# Patient Record
Sex: Male | Born: 1995 | Hispanic: Yes | Marital: Single | Smoking: Current every day smoker
Health system: Southern US, Community
[De-identification: ages and names within clinical notes are randomized; demographics above are authoritative.]

---

## 2017-08-12 ENCOUNTER — Ambulatory Visit (INDEPENDENT_AMBULATORY_CARE_PROVIDER_SITE_OTHER): Payer: Worker's Compensation

## 2017-08-12 ENCOUNTER — Ambulatory Visit (INDEPENDENT_AMBULATORY_CARE_PROVIDER_SITE_OTHER): Payer: Worker's Compensation | Admitting: Emergency Medicine

## 2017-08-12 DIAGNOSIS — S6701XA Crushing injury of right thumb, initial encounter: Secondary | ICD-10-CM

## 2017-08-12 DIAGNOSIS — S61011A Laceration without foreign body of right thumb without damage to nail, initial encounter: Secondary | ICD-10-CM

## 2017-08-12 DIAGNOSIS — M79644 Pain in right finger(s): Secondary | ICD-10-CM

## 2017-08-12 DIAGNOSIS — S62521B Displaced fracture of distal phalanx of right thumb, initial encounter for open fracture: Secondary | ICD-10-CM

## 2017-08-12 MED ORDER — HYDROCODONE-ACETAMINOPHEN 5-325 MG PO TABS: 1.0000 | ORAL_TABLET | Freq: Four times a day (QID) | ORAL | 0 refills | Status: DC | PRN

## 2017-08-12 MED ORDER — CEPHALEXIN 500 MG PO CAPS: 500.0000 mg | ORAL_CAPSULE | Freq: Three times a day (TID) | ORAL | 0 refills | Status: AC

## 2017-08-12 NOTE — Patient Instructions (Addendum)
Laceration Care, Adult A laceration is a cut that goes through all layers of the skin. The cut also goes into the tissue that is right under the skin. Some cuts heal on their own. Others need to be closed with stitches (sutures), staples, skin adhesive strips, or wound glue. Taking care of your cut lowers your risk of infection and helps your cut to heal better. How to take care of your cut For stitches or staples:  Keep the wound clean and dry.  If you were given a bandage (dressing), you should change it at least one time per day or as told by your doctor. You should also change it if it gets wet or dirty.  Keep the wound completely dry for the first 24 hours or as told by your doctor. After that time, you may take a shower or a bath. However, make sure that the wound is not soaked in water until after the stitches or staples have been removed.  Clean the wound one time each day or as told by your doctor: ? Wash the wound with soap and water. ? Rinse the wound with water until all of the soap comes off. ? Pat the wound dry with a clean towel. Do not rub the wound.  After you clean the wound, put a thin layer of antibiotic ointment on it as told by your doctor. This ointment: ? Helps to prevent infection. ? Keeps the bandage from sticking to the wound.  Have your stitches or staples removed as told by your doctor. If your doctor used skin adhesive strips:  Keep the wound clean and dry.  If you were given a bandage, you should change it at least one time per day or as told by your doctor. You should also change it if it gets dirty or wet.  Do not get the skin adhesive strips wet. You can take a shower or a bath, but be careful to keep the wound dry.  If the wound gets wet, pat it dry with a clean towel. Do not rub the wound.  Skin adhesive strips fall off on their own. You can trim the strips as the wound heals. Do not remove any strips that are still stuck to the wound. They will fall  off after a while. If your doctor used wound glue:  Try to keep your wound dry, but you may briefly wet it in the shower or bath. Do not soak the wound in water, such as by swimming.  After you take a shower or a bath, gently pat the wound dry with a clean towel. Do not rub the wound.  Do not do any activities that will make you really sweaty until the skin glue has fallen off on its own.  Do not apply liquid, cream, or ointment medicine to your wound while the skin glue is still on.  If you were given a bandage, you should change it at least one time per day or as told by your doctor. You should also change it if it gets dirty or wet.  If a bandage is placed over the wound, do not let the tape for the bandage touch the skin glue.  Do not pick at the glue. The skin glue usually stays on for 5-10 days. Then, it falls off of the skin. General Instructions  To help prevent scarring, make sure to cover your wound with sunscreen whenever you are outside after stitches are removed, after adhesive strips are removed, or   when wound glue stays in place and the wound is healed. Make sure to wear a sunscreen of at least 30 SPF.  Take over-the-counter and prescription medicines only as told by your doctor.  If you were given antibiotic medicine or ointment, take or apply it as told by your doctor. Do not stop using the antibiotic even if your wound is getting better.  Do not scratch or pick at the wound.  Keep all follow-up visits as told by your doctor. This is important.  Check your wound every day for signs of infection. Watch for: ? Redness, swelling, or pain. ? Fluid, blood, or pus.  Raise (elevate) the injured area above the level of your heart while you are sitting or lying down, if possible. Get help if:  You got a tetanus shot and you have any of these problems at the injection site: ? Swelling. ? Very bad pain. ? Redness. ? Bleeding.  You have a fever.  A wound that was  closed breaks open.  You notice a bad smell coming from your wound or your bandage.  You notice something coming out of the wound, such as wood or glass.  Medicine does not help your pain.  You have more redness, swelling, or pain at the site of your wound.  You have fluid, blood, or pus coming from your wound.  You notice a change in the color of your skin near your wound.  You need to change the bandage often because fluid, blood, or pus is coming from the wound.  You start to have a new rash.  You start to have numbness around the wound. Get help right away if:  You have very bad swelling around the wound.  Your pain suddenly gets worse and is very bad.  You notice painful lumps near the wound or on skin that is anywhere on your body.  You have a red streak going away from your wound.  The wound is on your hand or foot and you cannot move a finger or toe like you usually can.  The wound is on your hand or foot and you notice that your fingers or toes look pale or bluish. This information is not intended to replace advice given to you by your health care provider. Make sure you discuss any questions you have with your health care provider. Document Released: 04/01/2008 Document Revised: 03/21/2016 Document Reviewed: 10/10/2014 Elsevier Interactive Patient Education  2018 [employer].  Thumb Fracture A thumb fracture is a break in one of the two bones of your thumb. The thumb bone that goes from the tip of your thumb to the first joint in your thumb is called the distal phalanx. The thumb bone that goes from the first joint to the joint at the base of your thumb is called the proximal phalanx. Breaks that occur at the joints of your thumb are harder to treat. A broken thumb is more serious than a break in one of your other fingers because you need your thumb for grasping. Thumb fractures are also more likely to lead to pain and stiffness years after healing (arthritis). What  are the causes? Thumb fractures may be caused by:  A direct blow to your thumb.  Stress on your thumb from it being pulled out of place.  These types of injuries often happen as a result of:  Car accidents.  Bicycle accidents.  Falling with your hand outstretched.  Participating in sports such as wrestling, hockey, football, or  skiing.  What increases the risk? You may be more likely to break your thumb if you have a condition that causes your bones to become thin and brittle (osteoporosis). What are the signs or symptoms? The most common symptom is severe pain at the fracture site. Other signs and symptoms may include:  Swelling.  Bruising.  Not being able to move the thumb.  An abnormal shape of the thumb (deformity).  Numbness or coldness.  A red, black, or blue thumbnail.  How is this diagnosed? Your health care provider may suspect a thumb fracture if you recently injured your thumb and have signs and symptoms of a fracture. An X-ray of your thumb may be done to confirm the diagnosis and determine how bad the break is. How is this treated? A thumb fracture should be treated as soon as possible. You may need to wear a padded splint to keep your thumb from moving and to protect your thumb until you can get a cast or have surgery. Treatment options include:  Immobilization. ? A cast or splint is put on the injured area without changing the position of the broken bone. ? You may have to wear a type of cast called a spica cast or hitchhiker cast to hold the thumb in the proper position. ? A cast is usually left on for 4?6 weeks.  Closed reduction. ? In this procedure, the bones are put back into position without surgery.  Open reduction and internal fixation (ORIF). This is a surgical procedure. ? First, the fracture site is opened up. ? Then, the bone pieces are fixed into place with metal screws, plates, or wires.  External fixation. ? In this type of open  reduction, the fracture is held in place by metal pins. ? The pins are attached to a stabilizing bar outside your skin.  You may need to wear a cast after surgery for up to 6 weeks.  You may need to return for X-rays to make sure your thumb is healing properly.  After your cast is taken off, you may need to do hand exercises (physical therapy) to get movement back in your thumb.  It may take another 3 months to regain complete use of your thumb.  Follow these instructions at home:  Take medicines only as directed by your health care provider.  Keep your hand elevated above the level of your heart when resting.  Keep your cast dry when bathing. Cover it with a plastic bag as directed by your health care provider.  After your cast is removed, exercise your thumb at home. Your health care provider may suggest that you: ? Move your thumb in circles. ? Touch your thumb to your little finger. ? Do these exercises several times a day.  Ask your health care provider whether you can use a hand exerciser to strengthen your muscles.  If your thumb feels stiff while you are exercising it, try doing the exercises while soaking your hand in warm water.  Keep all follow-up visits as directed by your health care provider. This is important. Contact a health care provider if:  You have more than a small spot of bleeding from under your cast or splint.  Your pain medicine is not helping.  You have a fever.  You have numbness or tingling in the injured area.  Your cast becomes loose or damaged.  You notice a bad odor or discharge coming from under your cast. Get help right away if:  You have  pain that is very bad or getting worse.  You lose feeling in your thumb.  Your thumb turns pale or blue.  Your thumb feels cold.  You have drainage, redness, or swelling at the injury site. This information is not intended to replace advice given to you by your health care provider. Make sure  you discuss any questions you have with your health care provider. Document Released: 07/13/2003 Document Revised: 06/16/2016 Document Reviewed: 12/17/2013 Elsevier Interactive Patient Education  [employer].

## 2017-08-12 NOTE — Progress Notes (Signed)
Stephen George 21 y.o.   No chief complaint on file.   HISTORY OF PRESENT ILLNESS: This is a 21 y.o. male complaining of crushing injury to distal right thumb sustained today; sustained laceration.  HPI   Prior to Admission medications   Medication Sig Start Date End Date Taking? Authorizing Provider  cephALEXin (KEFLEX) 500 MG capsule Take 1 capsule (500 mg total) by mouth 3 (three) times daily. 08/12/17 08/19/17  Georgina Quint, MD  HYDROcodone-acetaminophen (NORCO) 5-325 MG tablet Take 1 tablet by mouth every 6 (six) hours as needed for moderate pain. 08/12/17   Georgina Quint, MD    Allergies not on file  Patient Active Problem List   Diagnosis Date Noted  . Laceration of right thumb without foreign body without damage to nail 08/12/2017  . Crushing injury of right thumb 08/12/2017  . Thumb pain, right 08/12/2017    No past medical history on file.  No past surgical history on file.  Social History   Social History  . Marital status: Single    Spouse name: N/A  . Number of children: N/A  . Years of education: N/A   Occupational History  . Not on file.   Social History Main Topics  . Smoking status: Not on file  . Smokeless tobacco: Not on file  . Alcohol use Not on file  . Drug use: Unknown  . Sexual activity: Not on file   Other Topics Concern  . Not on file   Social History Narrative  . No narrative on file    No family history on file.   Review of Systems  Constitutional: Negative for chills and fever.  Respiratory: Negative for shortness of breath.   Gastrointestinal: Negative for nausea and vomiting.  Musculoskeletal:       Thumb pain  Skin:       +laceration  All other systems reviewed and are negative.    Physical Exam  Constitutional: He is oriented to person, place, and time. He appears well-developed and well-nourished.  HENT:  Head: Normocephalic and atraumatic.  Eyes: Pupils are equal, round, and reactive to  light. EOM are normal.  Neck: Normal range of motion. Neck supple.  Cardiovascular: Normal rate.   Pulmonary/Chest: Effort normal.  Musculoskeletal: Normal range of motion.  Right thumb: +distal ecchymosis; nail intact and well attached. Several small lacerations with 5 cm total length; +crepitus c/w Fx; +oozing blood. NVI  Neurological: He is alert and oriented to person, place, and time.  Skin: Skin is warm and dry. Capillary refill takes less than 2 seconds.  Psychiatric: He has a normal mood and affect. His behavior is normal.  Vitals reviewed.  Verbal consent obtained. Area cleansed with Betadine and digital block  with Lidocaine done. Explored and irrigated with copious amounts of NSS. No FB seen or felt. Closed with 7-5-0 Ethilon. No complications. Pt tolerated procedure well.  Dg Finger Thumb Right  Result Date: 08/12/2017 CLINICAL DATA:  Laceration EXAM: RIGHT THUMB 2+V COMPARISON:  None. FINDINGS: Frontal, oblique, and lateral views were obtained. There is soft tissue injury to the distal aspect of the first distal phalanx. There is a comminuted fracture of the distal aspect of the distal portion of the first distal phalanx with major fracture fragments in near anatomic alignment. No other fractures are evident. No dislocation. Joint spaces appear normal. No erosive change. IMPRESSION: Comminuted fracture distal aspect first distal phalanx with fracture fragments in near anatomic alignment. Associated soft tissue injury with soft tissue  air in the distal aspect of the first distal phalanx. No other fractures. No dislocations. No appreciable arthropathy. These results will be called to the ordering clinician or representative by the Radiologist Assistant, and communication documented in the PACS or zVision Dashboard. Electronically Signed   By: Bretta Bang III M.D.   On: 08/12/2017 17:30     ASSESSMENT & PLAN: Diagnoses and all orders for this visit:  Crushing injury of right  thumb, initial encounter -     HYDROcodone-acetaminophen (NORCO) 5-325 MG tablet; Take 1 tablet by mouth every 6 (six) hours as needed for moderate pain.  Laceration of right thumb without foreign body without damage to nail, initial encounter -     DG Finger Thumb Right; Future -     cephALEXin (KEFLEX) 500 MG capsule; Take 1 capsule (500 mg total) by mouth 3 (three) times daily.  Thumb pain, right -     HYDROcodone-acetaminophen (NORCO) 5-325 MG tablet; Take 1 tablet by mouth every 6 (six) hours as needed for moderate pain.  Open fracture of tuft of distal phalanx of right thumb  Follow up in 3 days, Friday 19th.     Patient Instructions  Laceration Care, Adult A laceration is a cut that goes through all layers of the skin. The cut also goes into the tissue that is right under the skin. Some cuts heal on their own. Others need to be closed with stitches (sutures), staples, skin adhesive strips, or wound glue. Taking care of your cut lowers your risk of infection and helps your cut to heal better. How to take care of your cut For stitches or staples:  Keep the wound clean and dry.  If you were given a bandage (dressing), you should change it at least one time per day or as told by your doctor. You should also change it if it gets wet or dirty.  Keep the wound completely dry for the first 24 hours or as told by your doctor. After that time, you may take a shower or a bath. However, make sure that the wound is not soaked in water until after the stitches or staples have been removed.  Clean the wound one time each day or as told by your doctor: ? Wash the wound with soap and water. ? Rinse the wound with water until all of the soap comes off. ? Pat the wound dry with a clean towel. Do not rub the wound.  After you clean the wound, put a thin layer of antibiotic ointment on it as told by your doctor. This ointment: ? Helps to prevent infection. ? Keeps the bandage from sticking to  the wound.  Have your stitches or staples removed as told by your doctor. If your doctor used skin adhesive strips:  Keep the wound clean and dry.  If you were given a bandage, you should change it at least one time per day or as told by your doctor. You should also change it if it gets dirty or wet.  Do not get the skin adhesive strips wet. You can take a shower or a bath, but be careful to keep the wound dry.  If the wound gets wet, pat it dry with a clean towel. Do not rub the wound.  Skin adhesive strips fall off on their own. You can trim the strips as the wound heals. Do not remove any strips that are still stuck to the wound. They will fall off after a while. If your  doctor used wound glue:  Try to keep your wound dry, but you may briefly wet it in the shower or bath. Do not soak the wound in water, such as by swimming.  After you take a shower or a bath, gently pat the wound dry with a clean towel. Do not rub the wound.  Do not do any activities that will make you really sweaty until the skin glue has fallen off on its own.  Do not apply liquid, cream, or ointment medicine to your wound while the skin glue is still on.  If you were given a bandage, you should change it at least one time per day or as told by your doctor. You should also change it if it gets dirty or wet.  If a bandage is placed over the wound, do not let the tape for the bandage touch the skin glue.  Do not pick at the glue. The skin glue usually stays on for 5-10 days. Then, it falls off of the skin. General Instructions  To help prevent scarring, make sure to cover your wound with sunscreen whenever you are outside after stitches are removed, after adhesive strips are removed, or when wound glue stays in place and the wound is healed. Make sure to wear a sunscreen of at least 30 SPF.  Take over-the-counter and prescription medicines only as told by your doctor.  If you were given antibiotic medicine or  ointment, take or apply it as told by your doctor. Do not stop using the antibiotic even if your wound is getting better.  Do not scratch or pick at the wound.  Keep all follow-up visits as told by your doctor. This is important.  Check your wound every day for signs of infection. Watch for: ? Redness, swelling, or pain. ? Fluid, blood, or pus.  Raise (elevate) the injured area above the level of your heart while you are sitting or lying down, if possible. Get help if:  You got a tetanus shot and you have any of these problems at the injection site: ? Swelling. ? Very bad pain. ? Redness. ? Bleeding.  You have a fever.  A wound that was closed breaks open.  You notice a bad smell coming from your wound or your bandage.  You notice something coming out of the wound, such as wood or glass.  Medicine does not help your pain.  You have more redness, swelling, or pain at the site of your wound.  You have fluid, blood, or pus coming from your wound.  You notice a change in the color of your skin near your wound.  You need to change the bandage often because fluid, blood, or pus is coming from the wound.  You start to have a new rash.  You start to have numbness around the wound. Get help right away if:  You have very bad swelling around the wound.  Your pain suddenly gets worse and is very bad.  You notice painful lumps near the wound or on skin that is anywhere on your body.  You have a red streak going away from your wound.  The wound is on your hand or foot and you cannot move a finger or toe like you usually can.  The wound is on your hand or foot and you notice that your fingers or toes look pale or bluish. This information is not intended to replace advice given to you by your health care provider. Make sure you discuss  any questions you have with your health care provider. Document Released: 04/01/2008 Document Revised: 03/21/2016 Document Reviewed:  10/10/2014 Elsevier Interactive Patient Education  2018 [employer].  Thumb Fracture A thumb fracture is a break in one of the two bones of your thumb. The thumb bone that goes from the tip of your thumb to the first joint in your thumb is called the distal phalanx. The thumb bone that goes from the first joint to the joint at the base of your thumb is called the proximal phalanx. Breaks that occur at the joints of your thumb are harder to treat. A broken thumb is more serious than a break in one of your other fingers because you need your thumb for grasping. Thumb fractures are also more likely to lead to pain and stiffness years after healing (arthritis). What are the causes? Thumb fractures may be caused by:  A direct blow to your thumb.  Stress on your thumb from it being pulled out of place.  These types of injuries often happen as a result of:  Car accidents.  Bicycle accidents.  Falling with your hand outstretched.  Participating in sports such as wrestling, hockey, football, or skiing.  What increases the risk? You may be more likely to break your thumb if you have a condition that causes your bones to become thin and brittle (osteoporosis). What are the signs or symptoms? The most common symptom is severe pain at the fracture site. Other signs and symptoms may include:  Swelling.  Bruising.  Not being able to move the thumb.  An abnormal shape of the thumb (deformity).  Numbness or coldness.  A red, black, or blue thumbnail.  How is this diagnosed? Your health care provider may suspect a thumb fracture if you recently injured your thumb and have signs and symptoms of a fracture. An X-ray of your thumb may be done to confirm the diagnosis and determine how bad the break is. How is this treated? A thumb fracture should be treated as soon as possible. You may need to wear a padded splint to keep your thumb from moving and to protect your thumb until you can get a  cast or have surgery. Treatment options include:  Immobilization. ? A cast or splint is put on the injured area without changing the position of the broken bone. ? You may have to wear a type of cast called a spica cast or hitchhiker cast to hold the thumb in the proper position. ? A cast is usually left on for 4?6 weeks.  Closed reduction. ? In this procedure, the bones are put back into position without surgery.  Open reduction and internal fixation (ORIF). This is a surgical procedure. ? First, the fracture site is opened up. ? Then, the bone pieces are fixed into place with metal screws, plates, or wires.  External fixation. ? In this type of open reduction, the fracture is held in place by metal pins. ? The pins are attached to a stabilizing bar outside your skin.  You may need to wear a cast after surgery for up to 6 weeks.  You may need to return for X-rays to make sure your thumb is healing properly.  After your cast is taken off, you may need to do hand exercises (physical therapy) to get movement back in your thumb.  It may take another 3 months to regain complete use of your thumb.  Follow these instructions at home:  Take medicines only as directed by  your health care provider.  Keep your hand elevated above the level of your heart when resting.  Keep your cast dry when bathing. Cover it with a plastic bag as directed by your health care provider.  After your cast is removed, exercise your thumb at home. Your health care provider may suggest that you: ? Move your thumb in circles. ? Touch your thumb to your little finger. ? Do these exercises several times a day.  Ask your health care provider whether you can use a hand exerciser to strengthen your muscles.  If your thumb feels stiff while you are exercising it, try doing the exercises while soaking your hand in warm water.  Keep all follow-up visits as directed by your health care provider. This is  important. Contact a health care provider if:  You have more than a small spot of bleeding from under your cast or splint.  Your pain medicine is not helping.  You have a fever.  You have numbness or tingling in the injured area.  Your cast becomes loose or damaged.  You notice a bad odor or discharge coming from under your cast. Get help right away if:  You have pain that is very bad or getting worse.  You lose feeling in your thumb.  Your thumb turns pale or blue.  Your thumb feels cold.  You have drainage, redness, or swelling at the injury site. This information is not intended to replace advice given to you by your health care provider. Make sure you discuss any questions you have with your health care provider. Document Released: 07/13/2003 Document Revised: 06/16/2016 Document Reviewed: 12/17/2013 Elsevier Interactive Patient Education  2018 [employer]vier Inc.     Edwina Barth, MD [facility]

## 2017-08-15 ENCOUNTER — Encounter: Payer: Self-pay | Admitting: Emergency Medicine

## 2017-08-15 ENCOUNTER — Ambulatory Visit (INDEPENDENT_AMBULATORY_CARE_PROVIDER_SITE_OTHER): Payer: Worker's Compensation | Admitting: Emergency Medicine

## 2017-08-15 VITALS — BP 122/72 | HR 72 | Temp 98.5°F | Resp 17 | Ht 68.0 in | Wt 164.0 lb

## 2017-08-15 DIAGNOSIS — S62521B Displaced fracture of distal phalanx of right thumb, initial encounter for open fracture: Secondary | ICD-10-CM

## 2017-08-15 DIAGNOSIS — M79644 Pain in right finger(s): Secondary | ICD-10-CM

## 2017-08-15 DIAGNOSIS — S6701XD Crushing injury of right thumb, subsequent encounter: Secondary | ICD-10-CM

## 2017-08-15 NOTE — Progress Notes (Signed)
Stephen George 21 y.o.   Chief Complaint  Patient presents with  . Follow-up    thumb injury     HISTORY OF PRESENT ILLNESS:  This is a 21 y.o. male here for follow up of crush injury to right thumb; sustained tuft fracture and lacerations. Doing well; no complaints; taking antibiotic; not taking Norco.  HPI   Prior to Admission medications   Medication Sig Start Date End Date Taking? Authorizing Provider  cephALEXin (KEFLEX) 500 MG capsule Take 1 capsule (500 mg total) by mouth 3 (three) times daily. 08/12/17 08/19/17 Yes Lamica Mccart, Eilleen Kempf, MD  HYDROcodone-acetaminophen (NORCO) 5-325 MG tablet Take 1 tablet by mouth every 6 (six) hours as needed for moderate pain. 08/12/17  Yes Georgina Quint, MD    Not on File  Patient Active Problem List   Diagnosis Date Noted  . Laceration of right thumb without foreign body without damage to nail 08/12/2017  . Crushing injury of right thumb 08/12/2017  . Thumb pain, right 08/12/2017  . Open fracture of tuft of distal phalanx of right thumb 08/12/2017    No past medical history on file.  No past surgical history on file.  Social History   Social History  . Marital status: Single    Spouse name: N/A  . Number of children: N/A  . Years of education: N/A   Occupational History  . Not on file.   Social History Main Topics  . Smoking status: Current Every Day Smoker    Packs/day: 1.00    Years: 3.00    Types: Cigars  . Smokeless tobacco: Never Used  . Alcohol use No  . Drug use: No  . Sexual activity: Not on file   Other Topics Concern  . Not on file   Social History Narrative  . No narrative on file    No family history on file.   Review of Systems  Constitutional: Negative for chills and fever.  Gastrointestinal: Negative for diarrhea, nausea and vomiting.  Skin: Negative for rash.  All other systems reviewed and are negative.  Vitals:   08/15/17 1017  BP: 122/72  Pulse: 72  Resp: 17  Temp: 98.5  F (36.9 C)  SpO2: 98%     Physical Exam  Constitutional: He is oriented to person, place, and time. He appears well-developed and well-nourished.  Eyes: Pupils are equal, round, and reactive to light. EOM are normal.  Neck: Normal range of motion.  Cardiovascular: Normal rate.   Pulmonary/Chest: Effort normal.  Musculoskeletal:  Right thumb: NVI; sutures intact; healing well; visible bruising; nail intact; no signs of infection.  Neurological: He is alert and oriented to person, place, and time.  Skin: Skin is warm and dry. Capillary refill takes less than 2 seconds.  Psychiatric: He has a normal mood and affect. His behavior is normal.  Vitals reviewed.  Open fracture of tuft of distal phalanx of right thumb Healing well.  Thumb pain, right Well controlled; improved    ASSESSMENT & PLAN: Stephen George was seen today for follow-up.  Diagnoses and all orders for this visit:  Crushing injury of right thumb, subsequent encounter  Open fracture of tuft of distal phalanx of right thumb  Thumb pain, right    Patient Instructions       IF you received an x-ray today, you will receive an invoice from [facility]. Please contact [facility] at [phone] with questions or concerns regarding your invoice.   IF you received labwork today, you will  receive an invoice from [employer]. Please contact [facility] at [phone] with questions or concerns regarding your invoice.   Our billing staff will not be able to assist you with questions regarding bills from these companies.  You will be contacted with the lab results as soon as they are available. The fastest way to get your results is to activate your My Chart account. Instructions are located on the last page of this paperwork. If you have not heard from [place] regarding the results in 2 weeks, please contact this office.      Laceration Care, Adult A laceration is a cut that goes through all layers of the  skin. The cut also goes into the tissue that is right under the skin. Some cuts heal on their own. Others need to be closed with stitches (sutures), staples, skin adhesive strips, or wound glue. Taking care of your cut lowers your risk of infection and helps your cut to heal better. How to take care of your cut For stitches or staples:  Keep the wound clean and dry.  If you were given a bandage (dressing), you should change it at least one time per day or as told by your doctor. You should also change it if it gets wet or dirty.  Keep the wound completely dry for the first 24 hours or as told by your doctor. After that time, you may take a shower or a bath. However, make sure that the wound is not soaked in water until after the stitches or staples have been removed.  Clean the wound one time each day or as told by your doctor: ? Wash the wound with soap and water. ? Rinse the wound with water until all of the soap comes off. ? Pat the wound dry with a clean towel. Do not rub the wound.  After you clean the wound, put a thin layer of antibiotic ointment on it as told by your doctor. This ointment: ? Helps to prevent infection. ? Keeps the bandage from sticking to the wound.  Have your stitches or staples removed as told by your doctor. If your doctor used skin adhesive strips:  Keep the wound clean and dry.  If you were given a bandage, you should change it at least one time per day or as told by your doctor. You should also change it if it gets dirty or wet.  Do not get the skin adhesive strips wet. You can take a shower or a bath, but be careful to keep the wound dry.  If the wound gets wet, pat it dry with a clean towel. Do not rub the wound.  Skin adhesive strips fall off on their own. You can trim the strips as the wound heals. Do not remove any strips that are still stuck to the wound. They will fall off after a while. If your doctor used wound glue:  Try to keep your wound  dry, but you may briefly wet it in the shower or bath. Do not soak the wound in water, such as by swimming.  After you take a shower or a bath, gently pat the wound dry with a clean towel. Do not rub the wound.  Do not do any activities that will make you really sweaty until the skin glue has fallen off on its own.  Do not apply liquid, cream, or ointment medicine to your wound while the skin glue is still on.  If you were given a bandage, you should  change it at least one time per day or as told by your doctor. You should also change it if it gets dirty or wet.  If a bandage is placed over the wound, do not let the tape for the bandage touch the skin glue.  Do not pick at the glue. The skin glue usually stays on for 5-10 days. Then, it falls off of the skin. General Instructions  To help prevent scarring, make sure to cover your wound with sunscreen whenever you are outside after stitches are removed, after adhesive strips are removed, or when wound glue stays in place and the wound is healed. Make sure to wear a sunscreen of at least 30 SPF.  Take over-the-counter and prescription medicines only as told by your doctor.  If you were given antibiotic medicine or ointment, take or apply it as told by your doctor. Do not stop using the antibiotic even if your wound is getting better.  Do not scratch or pick at the wound.  Keep all follow-up visits as told by your doctor. This is important.  Check your wound every day for signs of infection. Watch for: ? Redness, swelling, or pain. ? Fluid, blood, or pus.  Raise (elevate) the injured area above the level of your heart while you are sitting or lying down, if possible. Get help if:  You got a tetanus shot and you have any of these problems at the injection site: ? Swelling. ? Very bad pain. ? Redness. ? Bleeding.  You have a fever.  A wound that was closed breaks open.  You notice a bad smell coming from your wound or your  bandage.  You notice something coming out of the wound, such as wood or glass.  Medicine does not help your pain.  You have more redness, swelling, or pain at the site of your wound.  You have fluid, blood, or pus coming from your wound.  You notice a change in the color of your skin near your wound.  You need to change the bandage often because fluid, blood, or pus is coming from the wound.  You start to have a new rash.  You start to have numbness around the wound. Get help right away if:  You have very bad swelling around the wound.  Your pain suddenly gets worse and is very bad.  You notice painful lumps near the wound or on skin that is anywhere on your body.  You have a red streak going away from your wound.  The wound is on your hand or foot and you cannot move a finger or toe like you usually can.  The wound is on your hand or foot and you notice that your fingers or toes look pale or bluish. This information is not intended to replace advice given to you by your health care provider. Make sure you discuss any questions you have with your health care provider. Document Released: 04/01/2008 Document Revised: 03/21/2016 Document Reviewed: 10/10/2014 Elsevier Interactive Patient Education  2018 [employer] Inc[employer]     Edwina BarthMiguel Selso Mannor, MD [facility]

## 2017-08-15 NOTE — Patient Instructions (Addendum)
   IF you received an x-ray today, you will receive an invoice from [facility]. Please contact [facility] at [phone] with questions or concerns regarding your invoice.   IF you received labwork today, you will receive an invoice from [facility]. Please contact [facility] at [phone] with questions or concerns regarding your invoice.   Our billing staff will not be able to assist you with questions regarding bills from these companies.  You will be contacted with the lab results as soon as they are available. The fastest way to get your results is to activate your My Chart account. Instructions are located on the last page of this paperwork. If you have not heard from us regarding the results in 2 weeks, please contact this office.     Laceration Care, Adult A laceration is a cut that goes through all layers of the skin. The cut also goes into the tissue that is right under the skin. Some cuts heal on their own. Others need to be closed with stitches (sutures), staples, skin adhesive strips, or wound glue. Taking care of your cut lowers your risk of infection and helps your cut to heal better. How to take care of your cut For stitches or staples:  Keep the wound clean and dry.  If you were given a bandage (dressing), you should change it at least one time per day or as told by your doctor. You should also change it if it gets wet or dirty.  Keep the wound completely dry for the first 24 hours or as told by your doctor. After that time, you may take a shower or a bath. However, make sure that the wound is not soaked in water until after the stitches or staples have been removed.  Clean the wound one time each day or as told by your doctor: ? Wash the wound with soap and water. ? Rinse the wound with water until all of the soap comes off. ? Pat the wound dry with a clean towel. Do not rub the wound.  After you clean the wound, put a thin layer of antibiotic  ointment on it as told by your doctor. This ointment: ? Helps to prevent infection. ? Keeps the bandage from sticking to the wound.  Have your stitches or staples removed as told by your doctor. If your doctor used skin adhesive strips:  Keep the wound clean and dry.  If you were given a bandage, you should change it at least one time per day or as told by your doctor. You should also change it if it gets dirty or wet.  Do not get the skin adhesive strips wet. You can take a shower or a bath, but be careful to keep the wound dry.  If the wound gets wet, pat it dry with a clean towel. Do not rub the wound.  Skin adhesive strips fall off on their own. You can trim the strips as the wound heals. Do not remove any strips that are still stuck to the wound. They will fall off after a while. If your doctor used wound glue:  Try to keep your wound dry, but you may briefly wet it in the shower or bath. Do not soak the wound in water, such as by swimming.  After you take a shower or a bath, gently pat the wound dry with a clean towel. Do not rub the wound.  Do not do any activities that will make you really sweaty until   the skin glue has fallen off on its own.  Do not apply liquid, cream, or ointment medicine to your wound while the skin glue is still on.  If you were given a bandage, you should change it at least one time per day or as told by your doctor. You should also change it if it gets dirty or wet.  If a bandage is placed over the wound, do not let the tape for the bandage touch the skin glue.  Do not pick at the glue. The skin glue usually stays on for 5-10 days. Then, it falls off of the skin. General Instructions  To help prevent scarring, make sure to cover your wound with sunscreen whenever you are outside after stitches are removed, after adhesive strips are removed, or when wound glue stays in place and the wound is healed. Make sure to wear a sunscreen of at least 30  SPF.  Take over-the-counter and prescription medicines only as told by your doctor.  If you were given antibiotic medicine or ointment, take or apply it as told by your doctor. Do not stop using the antibiotic even if your wound is getting better.  Do not scratch or pick at the wound.  Keep all follow-up visits as told by your doctor. This is important.  Check your wound every day for signs of infection. Watch for: ? Redness, swelling, or pain. ? Fluid, blood, or pus.  Raise (elevate) the injured area above the level of your heart while you are sitting or lying down, if possible. Get help if:  You got a tetanus shot and you have any of these problems at the injection site: ? Swelling. ? Very bad pain. ? Redness. ? Bleeding.  You have a fever.  A wound that was closed breaks open.  You notice a bad smell coming from your wound or your bandage.  You notice something coming out of the wound, such as wood or glass.  Medicine does not help your pain.  You have more redness, swelling, or pain at the site of your wound.  You have fluid, blood, or pus coming from your wound.  You notice a change in the color of your skin near your wound.  You need to change the bandage often because fluid, blood, or pus is coming from the wound.  You start to have a new rash.  You start to have numbness around the wound. Get help right away if:  You have very bad swelling around the wound.  Your pain suddenly gets worse and is very bad.  You notice painful lumps near the wound or on skin that is anywhere on your body.  You have a red streak going away from your wound.  The wound is on your hand or foot and you cannot move a finger or toe like you usually can.  The wound is on your hand or foot and you notice that your fingers or toes look pale or bluish. This information is not intended to replace advice given to you by your health care provider. Make sure you discuss any questions  you have with your health care provider. Document Released: 04/01/2008 Document Revised: 03/21/2016 Document Reviewed: 10/10/2014 Elsevier Interactive Patient Education  2018 Elsevier Inc.  

## 2017-08-15 NOTE — Assessment & Plan Note (Signed)
Well controlled; improved

## 2017-08-15 NOTE — Assessment & Plan Note (Signed)
Healing well.

## 2017-08-21 ENCOUNTER — Encounter: Payer: Self-pay | Admitting: Urgent Care

## 2017-08-21 ENCOUNTER — Ambulatory Visit (INDEPENDENT_AMBULATORY_CARE_PROVIDER_SITE_OTHER): Payer: Worker's Compensation | Admitting: Urgent Care

## 2017-08-21 VITALS — BP 112/72 | HR 64 | Temp 98.5°F | Resp 16 | Ht 72.0 in | Wt 167.4 lb

## 2017-08-21 DIAGNOSIS — S6701XD Crushing injury of right thumb, subsequent encounter: Secondary | ICD-10-CM

## 2017-08-21 DIAGNOSIS — S61011D Laceration without foreign body of right thumb without damage to nail, subsequent encounter: Secondary | ICD-10-CM

## 2017-08-21 DIAGNOSIS — S62521B Displaced fracture of distal phalanx of right thumb, initial encounter for open fracture: Secondary | ICD-10-CM

## 2017-08-21 DIAGNOSIS — M79644 Pain in right finger(s): Secondary | ICD-10-CM

## 2017-08-21 DIAGNOSIS — S6701XA Crushing injury of right thumb, initial encounter: Secondary | ICD-10-CM

## 2017-08-21 MED ORDER — CEPHALEXIN 500 MG PO CAPS: 500.0000 mg | ORAL_CAPSULE | Freq: Four times a day (QID) | ORAL | 0 refills | Status: AC

## 2017-08-21 NOTE — Patient Instructions (Addendum)
Sutured Wound Care Sutures are stitches that can be used to close wounds. Taking care of your wound properly can help to prevent pain and infection. It can also help your wound to heal more quickly. How is this treated? Wound Care  Keep the wound clean and dry.  If you were given a bandage (dressing), you should change it at least once per day or as directed by your health care provider. You should also change it if it becomes wet or dirty.  Keep the wound completely dry for the first 24 hours or as directed by your health care provider. After that time, you may shower or bathe. However, make sure that the wound is not soaked in water until the sutures have been removed.  Clean the wound one time each day or as directed by your health care provider. ? Wash the wound with soap and water. ? Rinse the wound with water to remove all soap. ? Pat the wound dry with a clean towel. Do not rub the wound.  Aftercleaning the wound, apply a thin layer of antibioticointment as directed by your health care provider. This will help to prevent infection and keep the dressing from sticking to the wound.  Have the sutures removed as directed by your health care provider. General Instructions  Take or apply medicines only as directed by your health care provider.  To help prevent scarring, make sure to cover your wound with sunscreen whenever you are outside after the sutures are removed and the wound is healed. Make sure to wear a sunscreen of at least 30 SPF.  If you were prescribed an antibiotic medicine or ointment, finish all of it even if you start to feel better.  Do not scratch or pick at the wound.  Keep all follow-up visits as directed by your health care provider. This is important.  Check your wound every day for signs of infection. Watch for: ? Redness, swelling, or pain. ? Fluid, blood, or pus.  Raise (elevate) the injured area above the level of your heart while you are sitting or  lying down, if possible.  Avoid stretching your wound.  Drink enough fluids to keep your urine clear or pale yellow. Contact a health care provider if:  You received a tetanus shot and you have swelling, severe pain, redness, or bleeding at the injection site.  You have a fever.  A wound that was closed breaks open.  You notice a bad smell coming from the wound.  You notice something coming out of the wound, such as wood or glass.  Your pain is not controlled with medicine.  You have increased redness, swelling, or pain at the site of your wound.  You have fluid, blood, or pus coming from your wound.  You notice a change in the color of your skin near your wound.  You need to change the dressing frequently due to fluid, blood, or pus draining from the wound.  You develop a new rash.  You develop numbness around the wound. Get help right away if:  You develop severe swelling around the injury site.  Your pain suddenly increases and is severe.  You develop painful lumps near the wound or on skin that is anywhere on your body.  You have a red streak going away from your wound.  The wound is on your hand or foot and you cannot properly move a finger or toe.  The wound is on your hand or foot and you notice  that your fingers or toes look pale or bluish. This information is not intended to replace advice given to you by your health care provider. Make sure you discuss any questions you have with your health care provider. Document Released: 11/21/2004 Document Revised: 03/21/2016 Document Reviewed: 05/26/2013 Elsevier Interactive Patient Education  2017 [employer]     IF you received an x-ray today, you will [facility] an [facility]. Please contact [facility] at [phone] with questions or concerns regarding your invoice.   IF you received labwork today, you will receive an invoice from [facility]. Please contact [facility] at  [phone] with questions or concerns regarding your invoice.   Our billing staff will not be able to assist you with questions regarding bills from these companies.  You will be contacted with the lab results as soon as they are available. The fastest way to get your results is to activate your My Chart account. Instructions are located on the last page of this paperwork. If you have not heard from us regarding the results in 2 weeks, please contact this office.

## 2017-08-21 NOTE — Progress Notes (Signed)
   Patient: Stephen George [id number]  Subjective: Stephen George is returning for suture removal. Patient was initially seen 10/16, 19/2018 and had sutures placed for a right thumb laceration, open fracture of distal phalynx. He finished course of Keflex yesterday. His pain is improved but still there when he uses his right thumb, the more pressure he applies, the worse it hurts. Denies fever, drainage of pus or blood, wound dehiscence, edema. Does not have any current work restrictions. Has been working mostly using his left hand.   Objective: BP 112/72   Pulse 64   Temp 98.5 F (36.9 C) (Oral)   Resp 16   Ht 6' (1.829 m)   Wt 167 lb 6.4 oz (75.9 kg)   SpO2 99%   BMI 22.70 kg/m    Physical Exam  Constitutional: He is oriented to person, place, and time. He appears well-developed and well-nourished.  Cardiovascular: Normal rate.   Pulmonary/Chest: Effort normal.  Musculoskeletal:       Hands: Neurological: He is alert and oriented to person, place, and time.   #3 sutures removed without incident. Patient tolerated this well.  Assessment and Plan: Physical exam findings concerning. Will extend antibiotic course to another 7 days. Sutures were drainage was expressed were removed. Work restrictions provided. Wound care reviewed. Patient to f/u in 2 days. Return-to-clinic precautions discussed, patient verbalized understanding.   Wallis BambergMario Shani Fitch, PA-C [facility] [phone] 08/21/2017  2:51 PM

## 2017-08-23 ENCOUNTER — Ambulatory Visit (INDEPENDENT_AMBULATORY_CARE_PROVIDER_SITE_OTHER): Payer: Worker's Compensation | Admitting: Urgent Care

## 2017-08-23 ENCOUNTER — Encounter: Payer: Self-pay | Admitting: Urgent Care

## 2017-08-23 VITALS — BP 120/78 | HR 78 | Temp 98.6°F | Resp 16 | Ht 72.0 in | Wt 166.6 lb

## 2017-08-23 DIAGNOSIS — S62521B Displaced fracture of distal phalanx of right thumb, initial encounter for open fracture: Secondary | ICD-10-CM

## 2017-08-23 DIAGNOSIS — S6701XD Crushing injury of right thumb, subsequent encounter: Secondary | ICD-10-CM

## 2017-08-23 DIAGNOSIS — Z4802 Encounter for removal of sutures: Secondary | ICD-10-CM

## 2017-08-23 DIAGNOSIS — S61011D Laceration without foreign body of right thumb without damage to nail, subsequent encounter: Secondary | ICD-10-CM

## 2017-08-23 DIAGNOSIS — M79644 Pain in right finger(s): Secondary | ICD-10-CM

## 2017-08-23 NOTE — Progress Notes (Signed)
   Patient: Stephen George [id number]  Subjective: [name] is returning for suture removal. Reports improvement in his pain and swelling. Has not had drainage since his last OV, 08/21/2017. Sutures over finger pad of right thumb were left in place. Denies fever, drainage of pus or blood, wound dehiscence, edema, pain.   Objective: BP 120/78   Pulse 78   Temp 98.6 F (37 C) (Oral)   Resp 16   Ht 6' (1.829 m)   Wt 166 lb 9.6 oz (75.6 kg)   SpO2 99%   BMI 22.60 kg/m    Physical Exam  Constitutional: He is oriented to person, place, and time. He appears well-developed and well-nourished.  Cardiovascular: Normal rate.   Pulmonary/Chest: Effort normal.  Musculoskeletal:       Right hand: He exhibits tenderness (with deep palpation of distal portion of thumb, nail), laceration and swelling (trace). Normal sensation noted. Normal strength noted.       Hands: Neurological: He is alert and oriented to person, place, and time.   #5 sutures removed without incident. Patient tolerated this well.  Assessment and Plan: Improved. Return-to-clinic precautions discussed, patient verbalized understanding. Otherwise, follow up on 09/04/2017 for work restrictions.  Wallis BambergMario Jnaya Butrick, PA-C [facility] [phone] 08/23/2017  12:09 PM

## 2017-08-23 NOTE — Patient Instructions (Addendum)
Suture Removal, Care After Refer to this sheet in the next few weeks. These instructions provide you with information on caring for yourself after your procedure. Your health care provider may also give you more specific instructions. Your treatment has been planned according to current medical practices, but problems sometimes occur. Call your health care provider if you have any problems or questions after your procedure. What can I expect after the procedure? After your stitches (sutures) are removed, it is typical to have the following:  Some discomfort and swelling in the wound area.  Slight redness in the area.  Follow these instructions at home:  If you have skin adhesive strips over the wound area, do not take the strips off. They will fall off on their own in a few days. If the strips remain in place after 14 days, you may remove them.  Change any bandages (dressings) at least once a day or as directed by your health care provider. If the bandage sticks, soak it off with warm, soapy water.  Apply cream or ointment only as directed by your health care provider. If using cream or ointment, wash the area with soap and water 2 times a day to remove all the cream or ointment. Rinse off the soap and pat the area dry with a clean towel.  Keep the wound area dry and clean. If the bandage becomes wet or dirty, or if it develops a bad smell, change it as soon as possible.  Continue to protect the wound from injury.  Use sunscreen when out in the sun. New scars become sunburned easily. Contact a health care provider if:  You have increasing redness, swelling, or pain in the wound.  You see pus coming from the wound.  You have a fever.  You notice a bad smell coming from the wound or dressing.  Your wound breaks open (edges not staying together). This information is not intended to replace advice given to you by your health care provider. Make sure you discuss any questions you have  with your health care provider. Document Released: 07/09/2001 Document Revised: 03/21/2016 Document Reviewed: 05/26/2013 Elsevier Interactive Patient Education  2017 Elsevier Inc.     IF you received an x-ray today, you will receive an invoice from [facility]. Please contact [facility] at [phone] with questions or concerns regarding your invoice.   IF you received labwork today, you will receive an invoice from [facility]. Please contact [facility] at [phone] with questions or concerns regarding your invoice.   Our billing staff will not be able to assist you with questions regarding bills from these companies.  You will be contacted with the lab results as soon as they are available. The fastest way to get your results is to activate your My Chart account. Instructions are located on the last page of this paperwork. If you have not heard from us regarding the results in 2 weeks, please contact this office.      

## 2017-09-04 ENCOUNTER — Ambulatory Visit: Payer: Worker's Compensation | Admitting: Urgent Care

## 2018-11-04 IMAGING — DX DG FINGER THUMB 2+V*R*
3 series · 3 of 3 positions shown · non-contrast
Comparison: None.

CLINICAL DATA: Laceration

EXAM:
RIGHT THUMB 2+V

[finger ap]
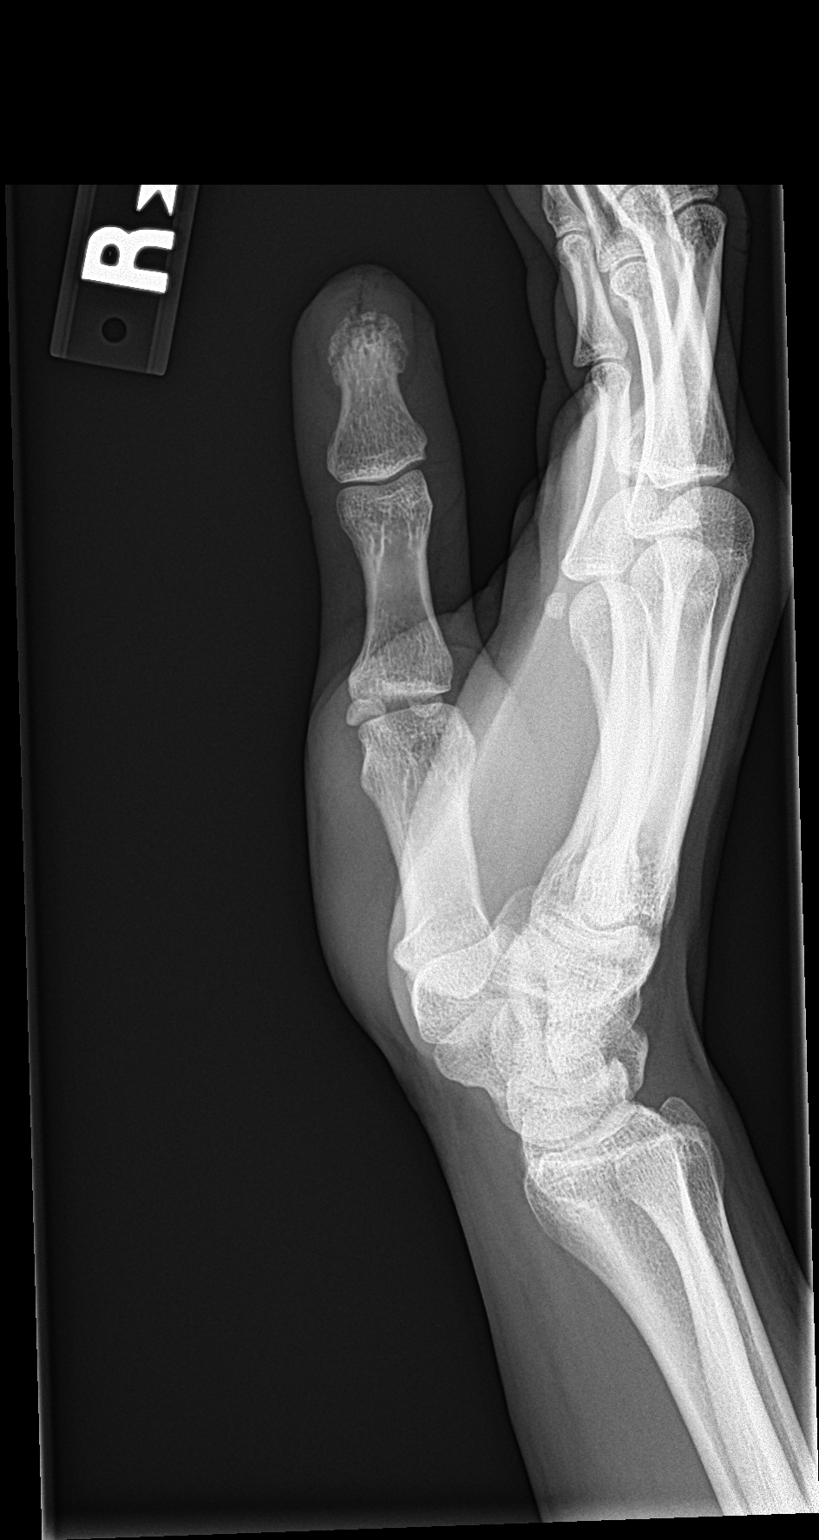

[finger obl]
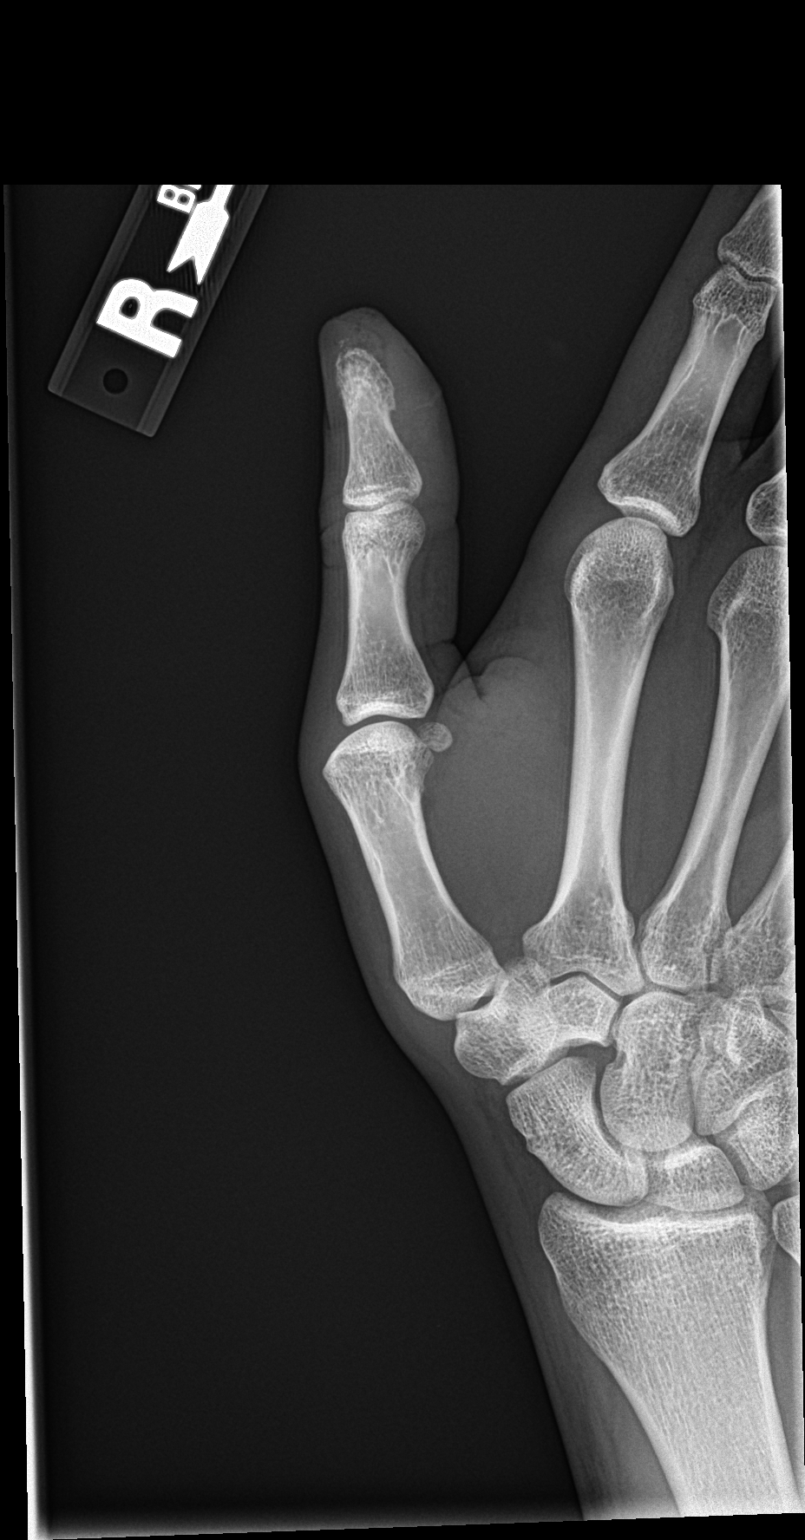

[finger lat]
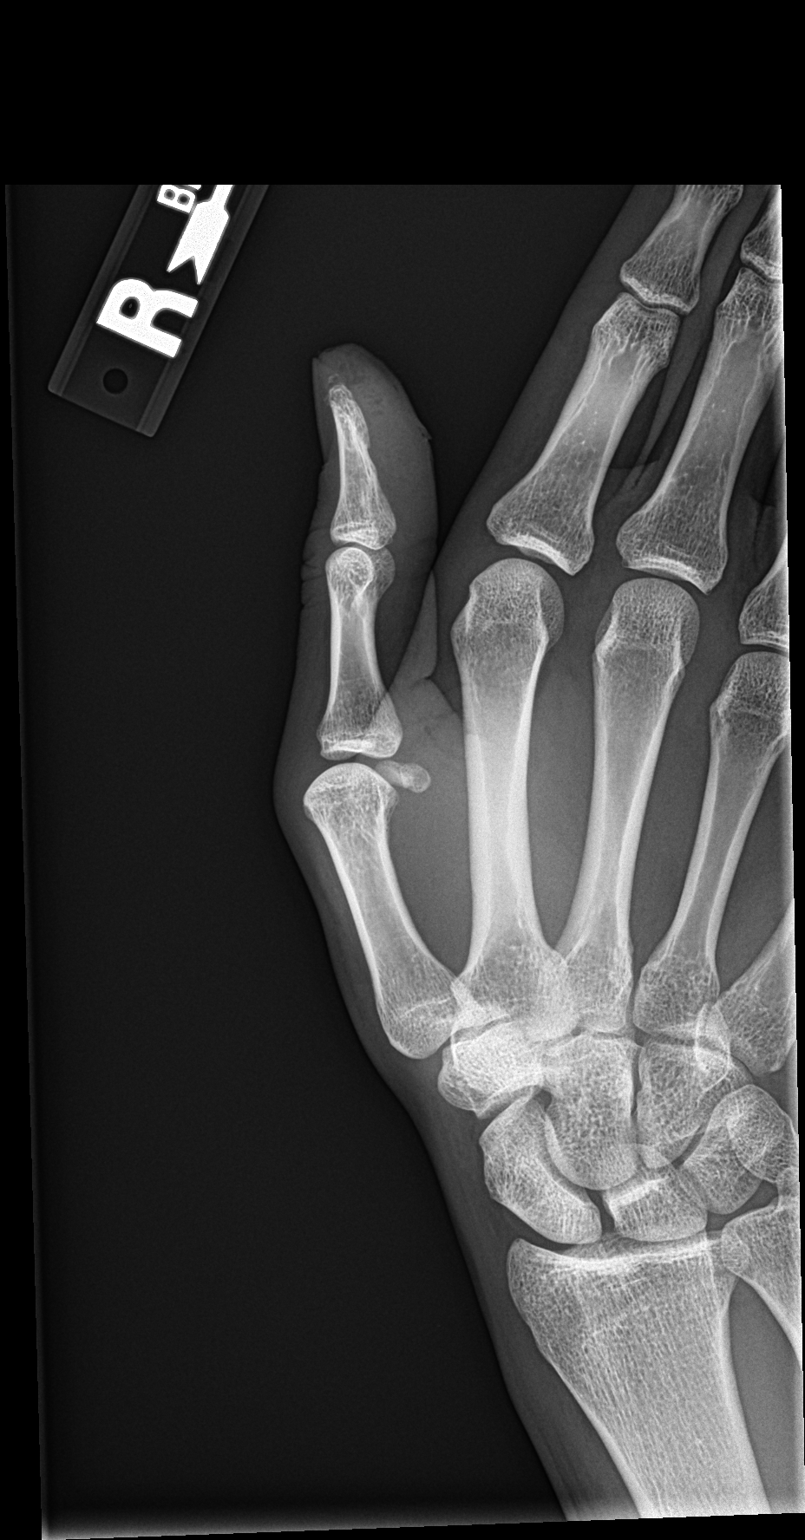

[3 of 3 positions shown; findings below may reference images not displayed]

FINDINGS: Frontal, oblique, and lateral views were obtained. There is soft
tissue injury to the distal aspect of the first distal phalanx.
There is a comminuted fracture of the distal aspect of the distal
portion of the first distal phalanx with major fracture fragments in
near anatomic alignment. No other fractures are evident. No
dislocation. Joint spaces appear normal. No erosive change.
IMPRESSION: Comminuted fracture distal aspect first distal phalanx with fracture
fragments in near anatomic alignment. Associated soft tissue injury
with soft tissue air in the distal aspect of the first distal
phalanx. No other fractures. No dislocations. No appreciable
arthropathy.

These results will be called to the ordering clinician or
representative by the Radiologist Assistant, and communication
documented in the PACS or zVision Dashboard.

## 2022-07-17 ENCOUNTER — Ambulatory Visit: Payer: Self-pay | Admitting: Family Medicine

## 2022-07-17 MED ORDER — NAPROXEN 500 MG PO TABS: 500.0000 mg | ORAL_TABLET | Freq: Two times a day (BID) | ORAL | 0 refills | Status: DC | PRN

## 2022-07-17 MED ORDER — BENZONATATE 100 MG PO CAPS: 100.0000 mg | ORAL_CAPSULE | Freq: Two times a day (BID) | ORAL | 0 refills | Status: DC | PRN

## 2022-07-17 NOTE — Progress Notes (Unsigned)
   Acute Office Visit  Subjective:     Patient ID: Nevan Creighton, male    DOB: [birth year], 26 y.o.   MRN: [id number]  No chief complaint on file.   HPI Patient is in today for ***  ROS      Objective:    There were no vitals taken for this visit. {Vitals History (Optional):23777}  Physical Exam  No results found for any visits on 07/17/22.      Assessment & Plan:   Problem List Items Addressed This Visit   None    Meds ordered this encounter  Medications   DISCONTD: benzonatate (TESSALON) 100 MG capsule    Sig: Take 1 capsule (100 mg total) by mouth 2 (two) times daily as needed for cough.    Dispense:  20 capsule    Refill:  0   DISCONTD: naproxen (NAPROSYN) 500 MG tablet    Sig: Take 1 tablet (500 mg total) by mouth 2 (two) times daily as needed for up to 7 days.    Dispense:  14 tablet    Refill:  0    Return if symptoms worsen or fail to improve.  Elwin Mocha, MD
